# Patient Record
Sex: Male | Born: 2000 | Race: White | Hispanic: No | Marital: Single | Smoking: Never smoker
Health system: Southern US, Community
[De-identification: ages and names within clinical notes are randomized; demographics above are authoritative.]

## PROBLEM LIST (undated history)

## (undated) DIAGNOSIS — T7840XA Allergy, unspecified, initial encounter: Secondary | ICD-10-CM

## (undated) HISTORY — DX: Allergy, unspecified, initial encounter: T78.40XA

---
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
## 2022-03-09 ENCOUNTER — Other Ambulatory Visit: Payer: Self-pay | Admitting: Student

## 2022-03-09 DIAGNOSIS — M25861 Other specified joint disorders, right knee: Secondary | ICD-10-CM

## 2022-03-11 ENCOUNTER — Ambulatory Visit
Admission: RE | Admit: 2022-03-11 | Discharge: 2022-03-11 | Disposition: A | Discharge status: Home / Self Care | Payer: Self-pay | Source: Ambulatory Visit | Attending: Student | Admitting: Student

## 2022-03-11 DIAGNOSIS — M25861 Other specified joint disorders, right knee: Secondary | ICD-10-CM

## 2022-03-12 ENCOUNTER — Other Ambulatory Visit: Payer: Self-pay | Admitting: Student
